# Patient Record
Sex: Male | Born: 1995 | Race: White | Hispanic: No | Marital: Single | Smoking: Current some day smoker
Health system: Southern US, Community
[De-identification: ages and names within clinical notes are randomized; demographics above are authoritative.]

## PROBLEM LIST (undated history)

## (undated) DIAGNOSIS — F32A Depression, unspecified: Secondary | ICD-10-CM

## (undated) DIAGNOSIS — F329 Major depressive disorder, single episode, unspecified: Secondary | ICD-10-CM

## (undated) DIAGNOSIS — F419 Anxiety disorder, unspecified: Secondary | ICD-10-CM

## (undated) DIAGNOSIS — F909 Attention-deficit hyperactivity disorder, unspecified type: Secondary | ICD-10-CM

## (undated) HISTORY — DX: Attention-deficit hyperactivity disorder, unspecified type: F90.9

## (undated) HISTORY — PX: TONSILLECTOMY: SUR1361

---
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
## 2017-02-28 ENCOUNTER — Telehealth: Payer: Self-pay | Admitting: Internal Medicine

## 2017-02-28 DIAGNOSIS — F902 Attention-deficit hyperactivity disorder, combined type: Secondary | ICD-10-CM

## 2017-02-28 NOTE — Telephone Encounter (Signed)
Mother came back and said that pt needs a refill on his Adderall .  Pt is completely out   Best number [phone] mother

## 2017-02-28 NOTE — Telephone Encounter (Signed)
Patient needs an appointment, please call and schedule appt. Thank you!

## 2017-03-01 MED ORDER — AMPHETAMINE-DEXTROAMPHETAMINE 20 MG PO TABS: 20.0000 mg | ORAL_TABLET | Freq: Every day | ORAL | 0 refills | Status: DC

## 2017-03-01 NOTE — Telephone Encounter (Signed)
appt made for pt, 30 day supply given

## 2017-03-12 ENCOUNTER — Ambulatory Visit (INDEPENDENT_AMBULATORY_CARE_PROVIDER_SITE_OTHER): Payer: BLUE CROSS/BLUE SHIELD | Admitting: Internal Medicine

## 2017-03-12 ENCOUNTER — Encounter: Payer: Self-pay | Admitting: Internal Medicine

## 2017-03-12 VITALS — BP 116/70 | HR 60 | Temp 98.2°F | Resp 16 | Ht 70.28 in | Wt 152.0 lb

## 2017-03-12 DIAGNOSIS — F902 Attention-deficit hyperactivity disorder, combined type: Secondary | ICD-10-CM | POA: Diagnosis not present

## 2017-03-12 MED ORDER — AMPHETAMINE-DEXTROAMPHETAMINE 20 MG PO TABS: 20.0000 mg | ORAL_TABLET | Freq: Every day | ORAL | 0 refills | Status: DC

## 2017-03-12 NOTE — Patient Instructions (Signed)

## 2017-03-12 NOTE — Progress Notes (Signed)
Subjective:  Patient ID: Edwin Ortiz, male    DOB: Oct 18, 1996  Age: 21 y.o. MRN: [id number]  CC: ADHD   HPI Edwin Ortiz presents for f/up -  Pt states ADD status overall stable on current meds with overall good compliance and tolerability, and good effectiveness with respect to ability for concentration and task completion.  Outpatient Medications Prior to Visit  Medication Sig Dispense Refill  . amphetamine-dextroamphetamine (ADDERALL) 20 MG tablet Take 1 tablet (20 mg total) by mouth daily. 30 tablet 0   No facility-administered medications prior to visit.     ROS Review of Systems  Constitutional: Negative.  Negative for appetite change, diaphoresis, fatigue and unexpected weight change.  HENT: Negative.   Eyes: Negative for visual disturbance.  Respiratory: Negative for chest tightness, shortness of breath and wheezing.   Cardiovascular: Negative for chest pain, palpitations and leg swelling.  Gastrointestinal: Negative for abdominal pain and diarrhea.  Endocrine: Negative.   Genitourinary: Negative.  Negative for difficulty urinating.  Musculoskeletal: Negative.   Skin: Negative.   Allergic/Immunologic: Negative.   Neurological: Negative.  Negative for dizziness and headaches.  Hematological: Negative for adenopathy. Does not bruise/bleed easily.  Psychiatric/Behavioral: Negative for agitation, behavioral problems and decreased concentration. The patient is not nervous/anxious and is not hyperactive.     Objective:  BP 116/70 (BP Location: Left Arm, Patient Position: Sitting, Cuff Size: Normal)   Pulse 60   Temp 98.2 F (36.8 C) (Oral)   Resp 16   Ht 5' 10.28" (1.785 m)   Wt 152 lb (68.9 kg)   SpO2 97%   BMI 21.64 kg/m   BP Readings from Last 3 Encounters:  03/12/17 116/70  09/07/16 122/72  08/13/16 122/72    Wt Readings from Last 3 Encounters:  03/12/17 152 lb (68.9 kg) (50 %, Z= 0.01)*  09/07/16 152 lb (68.9 kg) (54 %, Z= 0.09)*  08/13/16  148 lb (67.1 kg) (48 %, Z= -0.06)*   * Growth percentiles are based on CDC 2-20 Years data.    Physical Exam  Constitutional: He is oriented to person, place, and time. No distress.  HENT:  Mouth/Throat: Oropharynx is clear and moist. No oropharyngeal exudate.  Eyes: Conjunctivae are normal. Right eye exhibits no discharge. Left eye exhibits no discharge. No scleral icterus.  Neck: Normal range of motion. Neck supple. No JVD present. No tracheal deviation present. No thyromegaly present.  Cardiovascular: Normal rate, regular rhythm, normal heart sounds and intact distal pulses.  Exam reveals no gallop and no friction rub.   No murmur heard. Pulmonary/Chest: Effort normal and breath sounds normal. No stridor. No respiratory distress. He has no wheezes. He has no rales. He exhibits no tenderness.  Abdominal: Soft. Bowel sounds are normal. He exhibits no distension and no mass. There is no tenderness. There is no rebound and no guarding.  Musculoskeletal: Normal range of motion. He exhibits no edema, tenderness or deformity.  Lymphadenopathy:    He has no cervical adenopathy.  Neurological: He is oriented to person, place, and time.  Skin: Skin is warm and dry. No rash noted. He is not diaphoretic. No erythema. No pallor.  Psychiatric: He has a normal mood and affect. His behavior is normal. Judgment and thought content normal.  Vitals reviewed.   Lab Results  Component Value Date   WBC 10.0 04/25/2016   HGB 15.1 04/25/2016   HCT 45.4 04/25/2016   PLT 352.0 04/25/2016   GLUCOSE 97 04/25/2016   CHOL 127 04/25/2016  TRIG 127.0 04/25/2016   HDL 36.70 (L) 04/25/2016   LDLCALC 64 04/25/2016   ALT 29 04/25/2016   AST 44 (H) 04/25/2016   NA 137 04/25/2016   K 4.6 04/25/2016   CL 102 04/25/2016   CREATININE 1.00 04/25/2016   BUN 10 04/25/2016   CO2 32 04/25/2016   TSH 1.02 04/25/2016    Dg Hand Complete Left  Result Date: 05/07/2016 CLINICAL DATA:  The patient punched a bookcase  today with onset of left hand pain. EXAM: LEFT HAND - COMPLETE 3+ VIEW COMPARISON:  None. FINDINGS: The patient has a fracture of the distal diaphysis of the fifth metacarpal with slight dorsal displacement of the distal fragment and volar angulation of the metacarpal head. No other acute bony or joint abnormality is identified. IMPRESSION: Acute fracture distal diaphysis left fifth metacarpal as described. Electronically Signed   By: Drusilla Kannerhomas  Dalessio M.D.   On: 05/07/2016 20:56    Assessment & Plan:   Edwin Ortiz was seen today for adhd.  Diagnoses and all orders for this visit:  Attention deficit hyperactivity disorder (ADHD), combined type- He is doing well on the current dose of Adderall, he is finishing high school and will attend [employer] next year. -     Discontinue: amphetamine-dextroamphetamine (ADDERALL) 20 MG tablet; Take 1 tablet (20 mg total) by mouth daily. -     Discontinue: amphetamine-dextroamphetamine (ADDERALL) 20 MG tablet; Take 1 tablet (20 mg total) by mouth daily. -     Discontinue: amphetamine-dextroamphetamine (ADDERALL) 20 MG tablet; Take 1 tablet (20 mg total) by mouth daily. -     amphetamine-dextroamphetamine (ADDERALL) 20 MG tablet; Take 1 tablet (20 mg total) by mouth daily.   I am having Edwin Ortiz maintain his amphetamine-dextroamphetamine.  Meds ordered this encounter  Medications  . DISCONTD: amphetamine-dextroamphetamine (ADDERALL) 20 MG tablet    Sig: Take 1 tablet (20 mg total) by mouth daily.    Dispense:  30 tablet    Refill:  0  . DISCONTD: amphetamine-dextroamphetamine (ADDERALL) 20 MG tablet    Sig: Take 1 tablet (20 mg total) by mouth daily.    Dispense:  30 tablet    Refill:  0  . DISCONTD: amphetamine-dextroamphetamine (ADDERALL) 20 MG tablet    Sig: Take 1 tablet (20 mg total) by mouth daily.    Dispense:  30 tablet    Refill:  0  . amphetamine-dextroamphetamine (ADDERALL) 20 MG tablet    Sig: Take 1 tablet (20 mg total) by mouth daily.     Dispense:  30 tablet    Refill:  0     Follow-up: Return if symptoms worsen or fail to improve.  Sanda Lingerhomas Deiondre Harrower, MD

## 2017-04-25 ENCOUNTER — Ambulatory Visit (INDEPENDENT_AMBULATORY_CARE_PROVIDER_SITE_OTHER): Payer: BLUE CROSS/BLUE SHIELD | Admitting: Internal Medicine

## 2017-04-25 ENCOUNTER — Other Ambulatory Visit: Payer: Self-pay | Admitting: Orthopedic Surgery

## 2017-04-25 ENCOUNTER — Ambulatory Visit (HOSPITAL_COMMUNITY): Payer: BLUE CROSS/BLUE SHIELD | Admitting: Anesthesiology

## 2017-04-25 ENCOUNTER — Ambulatory Visit (HOSPITAL_COMMUNITY)
Admission: AD | Admit: 2017-04-25 | Discharge: 2017-04-25 | Disposition: A | Discharge status: Home / Self Care | Payer: BLUE CROSS/BLUE SHIELD | Source: Ambulatory Visit | Attending: Orthopedic Surgery | Admitting: Orthopedic Surgery

## 2017-04-25 ENCOUNTER — Encounter (HOSPITAL_COMMUNITY): Payer: Self-pay | Admitting: *Deleted

## 2017-04-25 ENCOUNTER — Encounter: Payer: Self-pay | Admitting: Internal Medicine

## 2017-04-25 ENCOUNTER — Encounter (HOSPITAL_COMMUNITY)
Admission: AD | Disposition: A | Discharge status: Home / Self Care | Payer: Self-pay | Source: Ambulatory Visit | Attending: Orthopedic Surgery

## 2017-04-25 VITALS — BP 120/80 | HR 67 | Ht 70.0 in | Wt 148.0 lb

## 2017-04-25 DIAGNOSIS — B9562 Methicillin resistant Staphylococcus aureus infection as the cause of diseases classified elsewhere: Secondary | ICD-10-CM | POA: Diagnosis not present

## 2017-04-25 DIAGNOSIS — M65041 Abscess of tendon sheath, right hand: Secondary | ICD-10-CM | POA: Diagnosis not present

## 2017-04-25 DIAGNOSIS — Z1611 Resistance to penicillins: Secondary | ICD-10-CM | POA: Diagnosis not present

## 2017-04-25 DIAGNOSIS — F17200 Nicotine dependence, unspecified, uncomplicated: Secondary | ICD-10-CM | POA: Insufficient documentation

## 2017-04-25 DIAGNOSIS — F909 Attention-deficit hyperactivity disorder, unspecified type: Secondary | ICD-10-CM | POA: Insufficient documentation

## 2017-04-25 DIAGNOSIS — L02511 Cutaneous abscess of right hand: Secondary | ICD-10-CM | POA: Insufficient documentation

## 2017-04-25 DIAGNOSIS — Z8249 Family history of ischemic heart disease and other diseases of the circulatory system: Secondary | ICD-10-CM | POA: Diagnosis not present

## 2017-04-25 DIAGNOSIS — Z806 Family history of leukemia: Secondary | ICD-10-CM | POA: Diagnosis not present

## 2017-04-25 DIAGNOSIS — L02519 Cutaneous abscess of unspecified hand: Secondary | ICD-10-CM | POA: Diagnosis present

## 2017-04-25 HISTORY — DX: Anxiety disorder, unspecified: F41.9

## 2017-04-25 HISTORY — DX: Major depressive disorder, single episode, unspecified: F32.9

## 2017-04-25 HISTORY — DX: Depression, unspecified: F32.A

## 2017-04-25 HISTORY — PX: I&D EXTREMITY: SHX5045

## 2017-04-25 LAB — CBC
HCT: 43.9 % (ref 39.0–52.0)
Hemoglobin: 14 g/dL (ref 13.0–17.0)
MCH: 28.6 pg (ref 26.0–34.0)
MCHC: 31.9 g/dL (ref 30.0–36.0)
MCV: 89.8 fL (ref 78.0–100.0)
PLATELETS: 286 10*3/uL (ref 150–400)
RBC: 4.89 MIL/uL (ref 4.22–5.81)
RDW: 13.7 % (ref 11.5–15.5)
WBC: 12 10*3/uL — AB (ref 4.0–10.5)

## 2017-04-25 LAB — COMPREHENSIVE METABOLIC PANEL
ALT: 30 U/L (ref 17–63)
AST: 30 U/L (ref 15–41)
Albumin: 4.2 g/dL (ref 3.5–5.0)
Alkaline Phosphatase: 90 U/L (ref 38–126)
Anion gap: 8 (ref 5–15)
BUN: 13 mg/dL (ref 6–20)
CHLORIDE: 103 mmol/L (ref 101–111)
CO2: 26 mmol/L (ref 22–32)
Calcium: 9.1 mg/dL (ref 8.9–10.3)
Creatinine, Ser: 0.89 mg/dL (ref 0.61–1.24)
GFR calc Af Amer: >60 mL/min (ref >60)
Glucose, Bld: 90 mg/dL (ref 65–99)
POTASSIUM: 3.7 mmol/L (ref 3.5–5.1)
Sodium: 137 mmol/L (ref 135–145)
Total Bilirubin: 0.5 mg/dL (ref 0.3–1.2)
Total Protein: 6.8 g/dL (ref 6.5–8.1)

## 2017-04-25 SURGERY — IRRIGATION AND DEBRIDEMENT EXTREMITY
Anesthesia: General | Site: Finger | Laterality: Right

## 2017-04-25 MED ORDER — LACTATED RINGERS IV SOLN
INTRAVENOUS | Status: DC
  Administered 2017-04-25: 19:00:00 via INTRAVENOUS

## 2017-04-25 MED ORDER — EPHEDRINE SULFATE 50 MG/ML IJ SOLN
INTRAMUSCULAR | Status: DC | PRN
  Administered 2017-04-25: 10 mg via INTRAVENOUS

## 2017-04-25 MED ORDER — MIDAZOLAM HCL 2 MG/2ML IJ SOLN
INTRAMUSCULAR | Status: AC
  Filled 2017-04-25: qty 2

## 2017-04-25 MED ORDER — CEFAZOLIN SODIUM-DEXTROSE 2-3 GM-% IV SOLR
INTRAVENOUS | Status: DC | PRN
  Administered 2017-04-25: 2 g via INTRAVENOUS

## 2017-04-25 MED ORDER — OXYCODONE HCL 5 MG PO TABS: 5.0000 mg | ORAL_TABLET | Freq: Once | ORAL | Status: DC | PRN

## 2017-04-25 MED ORDER — FENTANYL CITRATE (PF) 100 MCG/2ML IJ SOLN
50.0000 ug | Freq: Once | INTRAMUSCULAR | Status: AC
  Administered 2017-04-25: 50 ug via INTRAVENOUS
  Filled 2017-04-25: qty 1

## 2017-04-25 MED ORDER — LIDOCAINE HCL (CARDIAC) 20 MG/ML IV SOLN
INTRAVENOUS | Status: AC
  Filled 2017-04-25: qty 5

## 2017-04-25 MED ORDER — FENTANYL CITRATE (PF) 250 MCG/5ML IJ SOLN
INTRAMUSCULAR | Status: AC
  Filled 2017-04-25: qty 5

## 2017-04-25 MED ORDER — SULFAMETHOXAZOLE-TRIMETHOPRIM 800-160 MG PO TABS: 1.0000 | ORAL_TABLET | Freq: Two times a day (BID) | ORAL | 0 refills | Status: DC

## 2017-04-25 MED ORDER — SODIUM CHLORIDE 0.9 % IR SOLN
Status: DC | PRN
  Administered 2017-04-25: 3000 mL

## 2017-04-25 MED ORDER — LIDOCAINE HCL (CARDIAC) 20 MG/ML IV SOLN
INTRAVENOUS | Status: DC | PRN
  Administered 2017-04-25: 60 mg via INTRATRACHEAL

## 2017-04-25 MED ORDER — ONDANSETRON HCL 4 MG/2ML IJ SOLN: 4.0000 mg | Freq: Once | INTRAMUSCULAR | Status: DC | PRN

## 2017-04-25 MED ORDER — TRAMADOL HCL 50 MG PO TABS: 50.0000 mg | ORAL_TABLET | Freq: Three times a day (TID) | ORAL | 0 refills | Status: DC | PRN

## 2017-04-25 MED ORDER — FENTANYL CITRATE (PF) 250 MCG/5ML IJ SOLN
INTRAMUSCULAR | Status: DC | PRN
  Administered 2017-04-25: 125 ug via INTRAVENOUS
  Administered 2017-04-25: 50 ug via INTRAVENOUS

## 2017-04-25 MED ORDER — MIDAZOLAM HCL 2 MG/2ML IJ SOLN
1.0000 mg | Freq: Once | INTRAMUSCULAR | Status: AC
  Administered 2017-04-25: 1 mg via INTRAVENOUS
  Filled 2017-04-25: qty 1

## 2017-04-25 MED ORDER — OXYCODONE HCL 5 MG/5ML PO SOLN: 5.0000 mg | Freq: Once | ORAL | Status: DC | PRN

## 2017-04-25 MED ORDER — ONDANSETRON HCL 4 MG/2ML IJ SOLN
INTRAMUSCULAR | Status: DC | PRN
  Administered 2017-04-25: 4 mg via INTRAVENOUS

## 2017-04-25 MED ORDER — FENTANYL CITRATE (PF) 100 MCG/2ML IJ SOLN: 25.0000 ug | INTRAMUSCULAR | Status: DC | PRN

## 2017-04-25 MED ORDER — PROPOFOL 10 MG/ML IV BOLUS
INTRAVENOUS | Status: DC | PRN
  Administered 2017-04-25: 200 mg via INTRAVENOUS

## 2017-04-25 MED ORDER — BUPIVACAINE HCL (PF) 0.25 % IJ SOLN
INTRAMUSCULAR | Status: DC | PRN
  Administered 2017-04-25: 9 mL

## 2017-04-25 MED ORDER — MIDAZOLAM HCL 2 MG/2ML IJ SOLN
INTRAMUSCULAR | Status: AC
  Administered 2017-04-25: 1 mg via INTRAVENOUS
  Filled 2017-04-25: qty 2

## 2017-04-25 MED ORDER — HYDROCODONE-ACETAMINOPHEN 5-325 MG PO TABS: ORAL_TABLET | ORAL | 0 refills | Status: DC

## 2017-04-25 MED ORDER — BUPIVACAINE HCL (PF) 0.25 % IJ SOLN
INTRAMUSCULAR | Status: AC
  Filled 2017-04-25: qty 30

## 2017-04-25 MED ORDER — DEXAMETHASONE SODIUM PHOSPHATE 10 MG/ML IJ SOLN
INTRAMUSCULAR | Status: DC | PRN
  Administered 2017-04-25: 10 mg via INTRAVENOUS

## 2017-04-25 MED ORDER — PROPOFOL 10 MG/ML IV BOLUS
INTRAVENOUS | Status: AC
  Filled 2017-04-25: qty 20

## 2017-04-25 MED ORDER — CHLORHEXIDINE GLUCONATE 4 % EX LIQD: 60.0000 mL | Freq: Once | CUTANEOUS | Status: DC

## 2017-04-25 MED ORDER — AMOXICILLIN-POT CLAVULANATE 875-125 MG PO TABS: 1.0000 | ORAL_TABLET | Freq: Two times a day (BID) | ORAL | 0 refills | Status: DC

## 2017-04-25 MED ORDER — FENTANYL CITRATE (PF) 100 MCG/2ML IJ SOLN
INTRAMUSCULAR | Status: AC
  Administered 2017-04-25: 50 ug via INTRAVENOUS
  Filled 2017-04-25: qty 2

## 2017-04-25 MED ORDER — 0.9 % SODIUM CHLORIDE (POUR BTL) OPTIME
TOPICAL | Status: DC | PRN
  Administered 2017-04-25: 1000 mL

## 2017-04-25 SURGICAL SUPPLY — 55 items
BANDAGE ACE 3X5.8 VEL STRL LF (GAUZE/BANDAGES/DRESSINGS) ×3 IMPLANT
BANDAGE ACE 4X5 VEL STRL LF (GAUZE/BANDAGES/DRESSINGS) ×3 IMPLANT
BANDAGE COBAN STERILE 2 (GAUZE/BANDAGES/DRESSINGS) IMPLANT
BNDG COHESIVE 1X5 TAN STRL LF (GAUZE/BANDAGES/DRESSINGS) IMPLANT
BNDG CONFORM 2 STRL LF (GAUZE/BANDAGES/DRESSINGS) IMPLANT
BNDG ESMARK 4X9 LF (GAUZE/BANDAGES/DRESSINGS) ×3 IMPLANT
BNDG GAUZE ELAST 4 BULKY (GAUZE/BANDAGES/DRESSINGS) ×3 IMPLANT
BNDG PLASTER X FAST 3X3 WHT LF (CAST SUPPLIES) ×3 IMPLANT
CORDS BIPOLAR (ELECTRODE) ×3 IMPLANT
COVER SURGICAL LIGHT HANDLE (MISCELLANEOUS) ×3 IMPLANT
CUFF TOURNIQUET SINGLE 18IN (TOURNIQUET CUFF) ×3 IMPLANT
DECANTER SPIKE VIAL GLASS SM (MISCELLANEOUS) IMPLANT
DRAIN PENROSE 1/4X12 LTX STRL (WOUND CARE) IMPLANT
DRSG ADAPTIC 3X8 NADH LF (GAUZE/BANDAGES/DRESSINGS) IMPLANT
DRSG EMULSION OIL 3X3 NADH (GAUZE/BANDAGES/DRESSINGS) IMPLANT
DRSG PAD ABDOMINAL 8X10 ST (GAUZE/BANDAGES/DRESSINGS) IMPLANT
FACESHIELD WRAPAROUND (MASK) ×3 IMPLANT
GAUZE PACKING IODOFORM 1/4X15 (GAUZE/BANDAGES/DRESSINGS) ×3 IMPLANT
GAUZE SPONGE 4X4 12PLY STRL (GAUZE/BANDAGES/DRESSINGS) ×3 IMPLANT
GAUZE SPONGE 4X4 12PLY STRL LF (GAUZE/BANDAGES/DRESSINGS) ×3 IMPLANT
GAUZE XEROFORM 1X8 LF (GAUZE/BANDAGES/DRESSINGS) IMPLANT
GLOVE BIO SURGEON STRL SZ7.5 (GLOVE) ×3 IMPLANT
GLOVE BIOGEL PI IND STRL 8 (GLOVE) ×1 IMPLANT
GLOVE BIOGEL PI INDICATOR 8 (GLOVE) ×2
GOWN STRL REUS W/ TWL LRG LVL3 (GOWN DISPOSABLE) ×1 IMPLANT
GOWN STRL REUS W/TWL LRG LVL3 (GOWN DISPOSABLE) ×2
KIT BASIN OR (CUSTOM PROCEDURE TRAY) ×3 IMPLANT
KIT ROOM TURNOVER OR (KITS) ×3 IMPLANT
LOOP VESSEL MAXI BLUE (MISCELLANEOUS) IMPLANT
MANIFOLD NEPTUNE II (INSTRUMENTS) ×3 IMPLANT
NEEDLE HYPO 25X1 1.5 SAFETY (NEEDLE) ×3 IMPLANT
NS IRRIG 1000ML POUR BTL (IV SOLUTION) IMPLANT
PACK ORTHO EXTREMITY (CUSTOM PROCEDURE TRAY) ×3 IMPLANT
PAD ARMBOARD 7.5X6 YLW CONV (MISCELLANEOUS) ×6 IMPLANT
PAD CAST 4YDX4 CTTN HI CHSV (CAST SUPPLIES) ×1 IMPLANT
PADDING CAST COTTON 4X4 STRL (CAST SUPPLIES) ×2
SCRUB BETADINE 4OZ XXX (MISCELLANEOUS) ×3 IMPLANT
SET CYSTO W/LG BORE CLAMP LF (SET/KITS/TRAYS/PACK) ×3 IMPLANT
SOL PREP POV-IOD 4OZ 10% (MISCELLANEOUS) ×3 IMPLANT
SPONGE LAP 4X18 X RAY DECT (DISPOSABLE) ×3 IMPLANT
SUT ETHILON 4 0 P 3 18 (SUTURE) ×3 IMPLANT
SUT ETHILON 4 0 PS 2 18 (SUTURE) ×3 IMPLANT
SUT MON AB 5-0 P3 18 (SUTURE) IMPLANT
SWAB CULTURE ESWAB REG 1ML (MISCELLANEOUS) IMPLANT
SYR CONTROL 10ML LL (SYRINGE) ×3 IMPLANT
TOWEL OR 17X24 6PK STRL BLUE (TOWEL DISPOSABLE) ×3 IMPLANT
TOWEL OR 17X26 10 PK STRL BLUE (TOWEL DISPOSABLE) ×3 IMPLANT
TUBE ANAEROBIC SPECIMEN COL (MISCELLANEOUS) ×3 IMPLANT
TUBE CONNECTING 12'X1/4 (SUCTIONS) ×1
TUBE CONNECTING 12X1/4 (SUCTIONS) ×2 IMPLANT
TUBE FEEDING ENTERAL 5FR 16IN (TUBING) ×3 IMPLANT
TUBING CYSTO DISP (UROLOGICAL SUPPLIES) ×3 IMPLANT
UNDERPAD 30X30 (UNDERPADS AND DIAPERS) ×3 IMPLANT
WATER STERILE IRR 1000ML POUR (IV SOLUTION) ×3 IMPLANT
YANKAUER SUCT BULB TIP NO VENT (SUCTIONS) ×3 IMPLANT

## 2017-04-25 NOTE — Anesthesia Procedure Notes (Signed)
Procedure Name: LMA Insertion Date/Time: 04/25/2017 8:27 PM Performed by: Molli HazardGORDON, Cherish Runde M Pre-anesthesia Checklist: Patient identified, Emergency Drugs available, Suction available and Patient being monitored Patient Re-evaluated:Patient Re-evaluated prior to inductionOxygen Delivery Method: Circle system utilized Preoxygenation: Pre-oxygenation with 100% oxygen Intubation Type: IV induction Ventilation: Mask ventilation without difficulty LMA: LMA inserted LMA Size: 4.0 Number of attempts: 1 Placement Confirmation: positive ETCO2 Tube secured with: Tape Dental Injury: Teeth and Oropharynx as per pre-operative assessment

## 2017-04-25 NOTE — Anesthesia Postprocedure Evaluation (Signed)
Anesthesia Post Note  Patient: Roseanne RenoJohn Parker Bohac  Procedure(s) Performed: Procedure(s) (LRB): IRRIGATION AND DEBRIDEMENT SMALL FINGER (Right)     Patient location during evaluation: PACU Anesthesia Type: General Level of consciousness: awake, awake and alert and oriented Pain management: pain level controlled Vital Signs Assessment: post-procedure vital signs reviewed and stable Respiratory status: spontaneous breathing, nonlabored ventilation and respiratory function stable Cardiovascular status: blood pressure returned to baseline Anesthetic complications: no    Last Vitals:  Vitals:   04/25/17 2130 04/25/17 2138  BP:  112/65  Pulse: (!) 57 64  Resp: 17 15  Temp:      Last Pain:  Vitals:   04/25/17 2138  PainSc: 0-No pain                 Faust Thorington COKER

## 2017-04-25 NOTE — Transfer of Care (Signed)
Immediate Anesthesia Transfer of Care Note  Patient: Edwin Ortiz  Procedure(s) Performed: Procedure(s): IRRIGATION AND DEBRIDEMENT SMALL FINGER (Right)  Patient Location: PACU  Anesthesia Type:General  Level of Consciousness: awake, alert  and oriented  Airway & Oxygen Therapy: Patient connected to nasal cannula oxygen  Post-op Assessment: Report given to RN and Post -op Vital signs reviewed and stable  Post vital signs: Reviewed and stable  Last Vitals:  Vitals:   04/25/17 2123  Temp: (P) 36.5 C    Last Pain:  Vitals:   04/25/17 1834  PainSc: 10-Worst pain ever      Patients Stated Pain Goal: 8 (04/25/17 1834)  Complications: No apparent anesthesia complications

## 2017-04-25 NOTE — Patient Instructions (Signed)
Please take all new medication as prescribed - the antibiotic, and pain medication if needed  Please continue all other medications as before, and refills have been done if requested.  Please have the pharmacy call with any other refills you may need.  Please keep your appointments with your specialists as you may have planned  You will be contacted regarding the referral for: Hand Surg[facility] asap - to see [facility] now

## 2017-04-25 NOTE — Op Note (Signed)
NAME:  Edwin Ortiz, Edwin Ortiz                 ACCOUNT NO.:  [id number]  MEDICAL RECORD NO.:  [id number]  LOCATION:                                 FACILITY:  PHYSICIAN:  Betha LoaKevin Konica Stankowski, MD             DATE OF BIRTH:  DATE OF PROCEDURE:  04/25/2017 DATE OF DISCHARGE:                              OPERATIVE REPORT   PREOPERATIVE DIAGNOSIS:  Right small finger abscess.  POSTOPERATIVE DIAGNOSIS:  Right small finger abscess with flexor sheath infection.  PROCEDURE:  Incision and drainage of right small finger subcutaneous abscess and flexor tendon sheath.  SURGEON:  Betha LoaKevin Alvia Tory, MD  ASSISTANT:  None.  ANESTHESIA:  General.  IV FLUIDS:  Per anesthesia flow sheet.  ESTIMATED BLOOD LOSS:  Minimal.  COMPLICATIONS:  None.  SPECIMENS:  Cultures to Micro.  TOURNIQUET TIME:  25 minutes.  DISPOSITION:  Stable to PACU.  INDICATIONS:  Edwin Ortiz is an 21 year old left-hand dominant male, who over the past several days has had worsening swelling, erythema and pain of the right small finger.  He was seen by his primary care physician today and referred to me for further care.  I recommended incision and drainage in the operating room.  Risks, benefits and alternatives of the surgery were discussed including the risk of blood loss; infection; damage to nerves, vessels, tendons, ligaments, bone; failure of surgery; need for additional surgery; complications with wound healing; continued pain; continued infection; need for repeat irrigation and debridement. He voiced understanding of these risks and elected to proceed.  OPERATIVE COURSE:  After being identified preoperatively by myself, the patient and I agreed upon the procedure and site of procedure.  Surgical site was marked.  Risks, benefits and alternatives of the surgery were reviewed and he wished to proceed.  Surgical consent had been signed. He was transferred to the operating room and placed on the operating room table in supine position  with the right upper extremity on an armboard.  General anesthesia was induced by anesthesiologist.  Right upper extremity was prepped and draped in normal sterile orthopedic fashion.  A surgical pause was performed between surgeons, anesthesia and operating room staff; and all were in agreement as to the patient, procedure and site of procedure.  Tourniquet at the proximal aspect of the extremity was inflated to 250 mmHg after exsanguination of the limb with an Esmarch bandage.  Incision was made over the middle phalanx in the area of swelling and erythema.  This was carried into subcutaneous tissues by spreading technique.  Gross purulence was encountered. Cultures were taken for aerobes and anaerobes.  Incision was additionally made at the distal phalanx and gross purulence was encountered as well.  The two incisions were connected in a Brunner-type fashion.  The gross purulence was removed with the pickups.  The radial digital nerve and artery were identified and protected throughout the case.  There was a branch coursing across the surgical field, which was protected.  The sheath was visualized distally.  The finger was milked from proximally.  Some brownish watery fluid was produced from within the sheath.  It was felt that irrigation of the sheath  would be appropriate.  An incision was made at the volar aspect of the MP joint of the small finger.  This was again carried into subcutaneous tissues by spreading technique.  The sheath was opened at the proximal aspect of the A1 pulley.  The wrist and hand were milked from proximally.  No fluid came from proximally.  #5 pediatric feeding tube was then placed into the sheath from the proximal wound.  This was then used to irrigate with sterile saline.  Good effluent was obtained from both the proximal and distal wounds.  The sheath was copiously irrigated with sterile saline.  The wounds were then irrigated with sterile saline and  the feeding tube removed.  The wounds were all packed with 0.25-inch iodoform gauze.  A single 4-0 nylon suture was placed in the corner of the incision distally to keep the corner in appropriate position.  There was still plenty of wound open for drainage as necessary.  Digital block was performed with 9 mL of 0.25% plain Marcaine to aid in postoperative analgesia.  The wounds were dressed with sterile 4x4s and wrapped with a Kerlix bandage.  A dorsal splint was placed including the long, ring and small fingers.  This was wrapped with Kerlix and Ace bandage. Tourniquet was deflated at 25 minutes.  Fingertips were all pink with brisk capillary refill after deflation of the tourniquet.  The operative drapes were broken down, the patient was awoken from anesthesia safely. He was transferred back to the stretcher and taken to PACU in stable condition.  I will see him back in the office in 4 days for postoperative followup.  I will give him Norco 5/325 one to two p.o. q.6 hours p.r.n. pain, dispensed #30 and Bactrim DS 1 p.o. b.i.d. x7 days.     Betha Loa, MD     KK/MEDQ  D:  04/25/2017  T:  04/25/2017  Job:  [id number]

## 2017-04-25 NOTE — Assessment & Plan Note (Signed)
Mod to severe,  for augmentin course, pain control, and refer to hand surgury asap for possible I&D,  to f/u any worsening symptoms or concerns

## 2017-04-25 NOTE — H&P (Signed)
Edwin Ortiz is an 21 y.o. male.   Chief Complaint: right small finger infection HPI: Edwin Ortiz is an 21 year old left-hand dominant male who is noted issues with his right small finger over the past 4 days.  He is noted increasing swelling and erythema.  He has a throbbing pain that reaches 10 out of 10 in severity.  Is alleviated by nothing and aggravated with palpation.  He has had no fevers chills or night sweats.  He does not take any medications.  He saw his primary care physician today who referred him here for further care.  Xrays viewed and interpreted by me: ap and lateral views of right small finger no fractures, dislocations, radioopaque foreign bodies. Labs reviewed: none  Allergies: No Known Allergies  Past Medical History:  Diagnosis Date  . ADHD (attention deficit hyperactivity disorder) 2017    Past Surgical History:  Procedure Laterality Date  . ANTERIOR CRUCIATE LIGAMENT REPAIR Left 09/27/2015  . TONSILLECTOMY      Family History: Family History  Problem Relation Age of Onset  . Hypertension Paternal Grandmother   . Hypertension Mother   . High Cholesterol Mother   . High Cholesterol Father   . Leukemia Paternal Grandfather   . Cancer Neg Hx   . Diabetes Neg Hx   . Drug abuse Neg Hx   . Alcohol abuse Neg Hx   . Heart disease Neg Hx   . Hyperlipidemia Neg Hx   . Stroke Neg Hx     Social History:   reports that he has been smoking.  He has a 0.50 pack-year smoking history. He has never used smokeless tobacco. He reports that he drinks about 3.0 oz of alcohol per week . He reports that he uses drugs, including Marijuana.  Medications: No prescriptions prior to admission.    No results found for this or any previous visit (from the past 48 hour(s)).  No results found.   A comprehensive review of systems was negative. Review of Systems: No fevers, chills, night sweats, chest pain, shortness of breath, nausea, vomiting, diarrhea, constipation,  easy bleeding or bruising, headaches, dizziness, vision changes, fainting.   There were no vitals taken for this visit.  General appearance: alert, cooperative and appears stated age Head: Normocephalic, without obvious abnormality, atraumatic Neck: supple, symmetrical, trachea midline Resp: clear to auscultation bilaterally Cardio: regular rate and rhythm GI: non-tender Extremities: Intact sensation and capillary refill all digits.  +epl/fpl/io.  Right small finger swollen and erythematous at middle and distal phalanges.  Tender to palpation.  No proximal streaking. Pulses: 2+ and symmetric Skin: Skin color, texture, turgor normal. No rashes or lesions Neurologic: Grossly normal Incision/Wound: none  Assessment/Plan Right small finger abscess.  Recommend incision and drainage in OR.  Risks, benefits, and alternatives of surgery were discussed and the patient agrees with the plan of care.   [name] R 04/25/2017, 5:20 PM

## 2017-04-25 NOTE — Progress Notes (Signed)
   Subjective:    Patient ID: Edwin Ortiz, male    DOB: 1996-10-14, 21 y.o.   MRN: [id number]  HPI  Here with 2-3 days onset gradually worsening now severe red, tender, pain, swelling and focused area of white in the pad of the most distal right fifth finger.  No fever,  No drainage.  [place] Past Medical History:  Diagnosis Date  . ADHD (attention deficit hyperactivity disorder) 2017   Past Surgical History:  Procedure Laterality Date  . ANTERIOR CRUCIATE LIGAMENT REPAIR Left 09/27/2015  . TONSILLECTOMY      reports that he has been smoking.  He has a 0.50 pack-year smoking history. He has never used smokeless tobacco. He reports that he drinks about 3.0 oz of alcohol per week . He reports that he uses drugs, including Marijuana. family history includes High Cholesterol in his father and mother; Hypertension in his mother and paternal grandmother; Leukemia in his paternal grandfather. No Known Allergies Current Outpatient Prescriptions on File Prior to Visit  Medication Sig Dispense Refill  . amphetamine-dextroamphetamine (ADDERALL) 20 MG tablet Take 1 tablet (20 mg total) by mouth daily. 30 tablet 0   No current facility-administered medications on file prior to visit.     Review of Systems All otherwise neg per pt    Objective:   Physical Exam BP 120/80   Pulse 67   Ht 5\' 10"  (1.778 m)   Wt 148 lb (67.1 kg)   SpO2 99%   BMI 21.24 kg/m  VS noted,  Constitutional: Pt appears in NAD HENT: Head: NCAT.  Right Ear: External ear normal.  Left Ear: External ear normal.  Eyes: . Pupils are equal, round, and reactive to light. Conjunctivae and EOM are normal Nose: without d/c or deformity Neck: Neck supple. Gross normal ROM Cardiovascular: Normal rate and regular rhythm.   Pulmonary/Chest: Effort normal and breath sounds without rales or wheezing.  Right fifth finger with middle and distal finger 2+ red, tender, swelling with distal tip with  < 1/2 cm area severe tender white area, + fluctuance Neurological: Pt is alert. At baseline orientation, motor grossly intact Skin: Skin is warm. No rashes, other new lesions, no LE edema Psychiatric: Pt behavior is normal without agitation      Assessment & Plan:

## 2017-04-25 NOTE — Discharge Instructions (Signed)

## 2017-04-25 NOTE — Brief Op Note (Signed)
04/25/2017  9:08 PM  PATIENT:  Edwin RenoJohn Parker Twichell  21 y.o. male  PRE-OPERATIVE DIAGNOSIS:  Right small finger abscess  POST-OPERATIVE DIAGNOSIS:  Right small finger abscess with flexor sheath infection  PROCEDURE:  Procedure(s): IRRIGATION AND DEBRIDEMENT SMALL FINGER (Right)  SURGEON:  Surgeon(s) and Role:    Betha Loa* Sonia Bromell, MD - Primary  PHYSICIAN ASSISTANT:   ASSISTANTS: none   ANESTHESIA:   general  EBL:  Total I/O In: 800 [I.V.:800] Out: 20 [Blood:20]  BLOOD ADMINISTERED:none  DRAINS: iodoform packing  LOCAL MEDICATIONS USED:  MARCAINE     SPECIMEN:  Source of Specimen:  right small finger  DISPOSITION OF SPECIMEN:  micro  COUNTS:  YES  TOURNIQUET:   Total Tourniquet Time Documented: Upper Arm (Right) - 25 minutes Total: Upper Arm (Right) - 25 minutes   DICTATION: .Other Dictation: Dictation Number [id number]  PLAN OF CARE: Discharge to home after PACU  PATIENT DISPOSITION:  PACU - hemodynamically stable.

## 2017-04-25 NOTE — Anesthesia Preprocedure Evaluation (Signed)
Anesthesia Evaluation  Patient identified by MRN, date of birth, ID band Patient awake    Reviewed: Allergy & Precautions, NPO status , Patient's Chart, lab work & pertinent test results  Airway Mallampati: I  TM Distance: >3 FB Neck ROM: Full    Dental  (+) Teeth Intact, Dental Advisory Given   Pulmonary Current Smoker,    breath sounds clear to auscultation       Cardiovascular  Rhythm:Regular Rate:Normal     Neuro/Psych    GI/Hepatic   Endo/Other    Renal/GU      Musculoskeletal   Abdominal   Peds  Hematology   Anesthesia Other Findings   Reproductive/Obstetrics                             Anesthesia Physical Anesthesia Plan  ASA: I and emergent  Anesthesia Plan: General   Post-op Pain Management:    Induction: Intravenous  PONV Risk Score and Plan: Ondansetron and Dexamethasone  Airway Management Planned: LMA  Additional Equipment:   Intra-op Plan:   Post-operative Plan:   Informed Consent: I have reviewed the patients History and Physical, chart, labs and discussed the procedure including the risks, benefits and alternatives for the proposed anesthesia with the patient or authorized representative who has indicated his/her understanding and acceptance.   Dental advisory given  Plan Discussed with: CRNA and Anesthesiologist  Anesthesia Plan Comments:         Anesthesia Quick Evaluation

## 2017-04-25 NOTE — Op Note (Signed)
[id number] 

## 2017-04-26 ENCOUNTER — Encounter (HOSPITAL_COMMUNITY): Payer: Self-pay | Admitting: Orthopedic Surgery

## 2017-04-28 ENCOUNTER — Other Ambulatory Visit: Payer: Self-pay | Admitting: Internal Medicine

## 2017-04-30 ENCOUNTER — Encounter (HOSPITAL_COMMUNITY): Payer: Self-pay | Admitting: Orthopedic Surgery

## 2017-04-30 NOTE — OR Nursing (Signed)
Addendum created to reflect correct times in PACU event log 

## 2017-05-01 LAB — AEROBIC/ANAEROBIC CULTURE (SURGICAL/DEEP WOUND)

## 2017-05-01 LAB — AEROBIC/ANAEROBIC CULTURE W GRAM STAIN (SURGICAL/DEEP WOUND)

## 2017-07-04 ENCOUNTER — Encounter: Payer: Self-pay | Admitting: Internal Medicine

## 2017-07-04 ENCOUNTER — Ambulatory Visit (INDEPENDENT_AMBULATORY_CARE_PROVIDER_SITE_OTHER): Payer: BLUE CROSS/BLUE SHIELD | Admitting: Internal Medicine

## 2017-07-04 DIAGNOSIS — L02415 Cutaneous abscess of right lower limb: Secondary | ICD-10-CM

## 2017-07-04 DIAGNOSIS — L0291 Cutaneous abscess, unspecified: Secondary | ICD-10-CM | POA: Insufficient documentation

## 2017-07-04 MED ORDER — SULFAMETHOXAZOLE-TRIMETHOPRIM 800-160 MG PO TABS: 1.0000 | ORAL_TABLET | Freq: Two times a day (BID) | ORAL | 0 refills | Status: DC

## 2017-07-04 NOTE — Assessment & Plan Note (Signed)
Rx for bactrim 1 week and return precautions discussed. Advised not to scratch and keep clean and dry.

## 2017-07-04 NOTE — Patient Instructions (Signed)
We have sent in the bactrim. Take 1 pill twice a day for 1 week.    

## 2017-07-04 NOTE — Progress Notes (Signed)
   Subjective:    Patient ID: Edwin RenoJohn Parker Marchetti, male    DOB: 10-13-1996, 21 y.o.   MRN: [id number]  HPI The patient is a 21 YO man coming in for bug bite which he scratched and got infected. He was scratching and had some skin breakdown. Now there is some surrounding redness. No target lesion. He did not have a tick bite. Denies fevers or chills. Scant drainage. Overall worsening.   Review of Systems  Constitutional: Negative for activity change, appetite change, fatigue, fever and unexpected weight change.  Respiratory: Negative.   Cardiovascular: Negative.   Gastrointestinal: Negative.   Musculoskeletal: Positive for myalgias. Negative for arthralgias, back pain and neck pain.  Skin: Positive for rash and wound. Negative for color change and pallor.  Neurological: Negative.       Objective:   Physical Exam  Constitutional: He is oriented to person, place, and time. He appears well-developed and well-nourished.  HENT:  Head: Normocephalic and atraumatic.  Eyes: EOM are normal.  Cardiovascular: Normal rate and regular rhythm.   Pulmonary/Chest: Effort normal. No respiratory distress. He has no wheezes. He has no rales.  Abdominal: Soft.  Neurological: He is alert and oriented to person, place, and time.  Skin: Skin is warm and dry.  Right lateral thigh above the knee infected wound, central scabbing with hard knot underneath, 3 mm scab with surrounding 1 cm diameter redness.    Vitals:   07/04/17 1452  BP: 100/68  Pulse: 78  Temp: 98.3 F (36.8 C)  TempSrc: Oral  SpO2: 99%  Weight: 143 lb (64.9 kg)  Height: 5' 10.2" (1.783 m)      Assessment & Plan:

## 2018-01-26 IMAGING — CR DG HAND COMPLETE 3+V*L*
3 series · 3 of 3 positions shown · non-contrast
Comparison: None.

CLINICAL DATA: The patient punched a bookcase today with onset of
left hand pain.

EXAM:
LEFT HAND - COMPLETE 3+ VIEW

[x hand pa left]
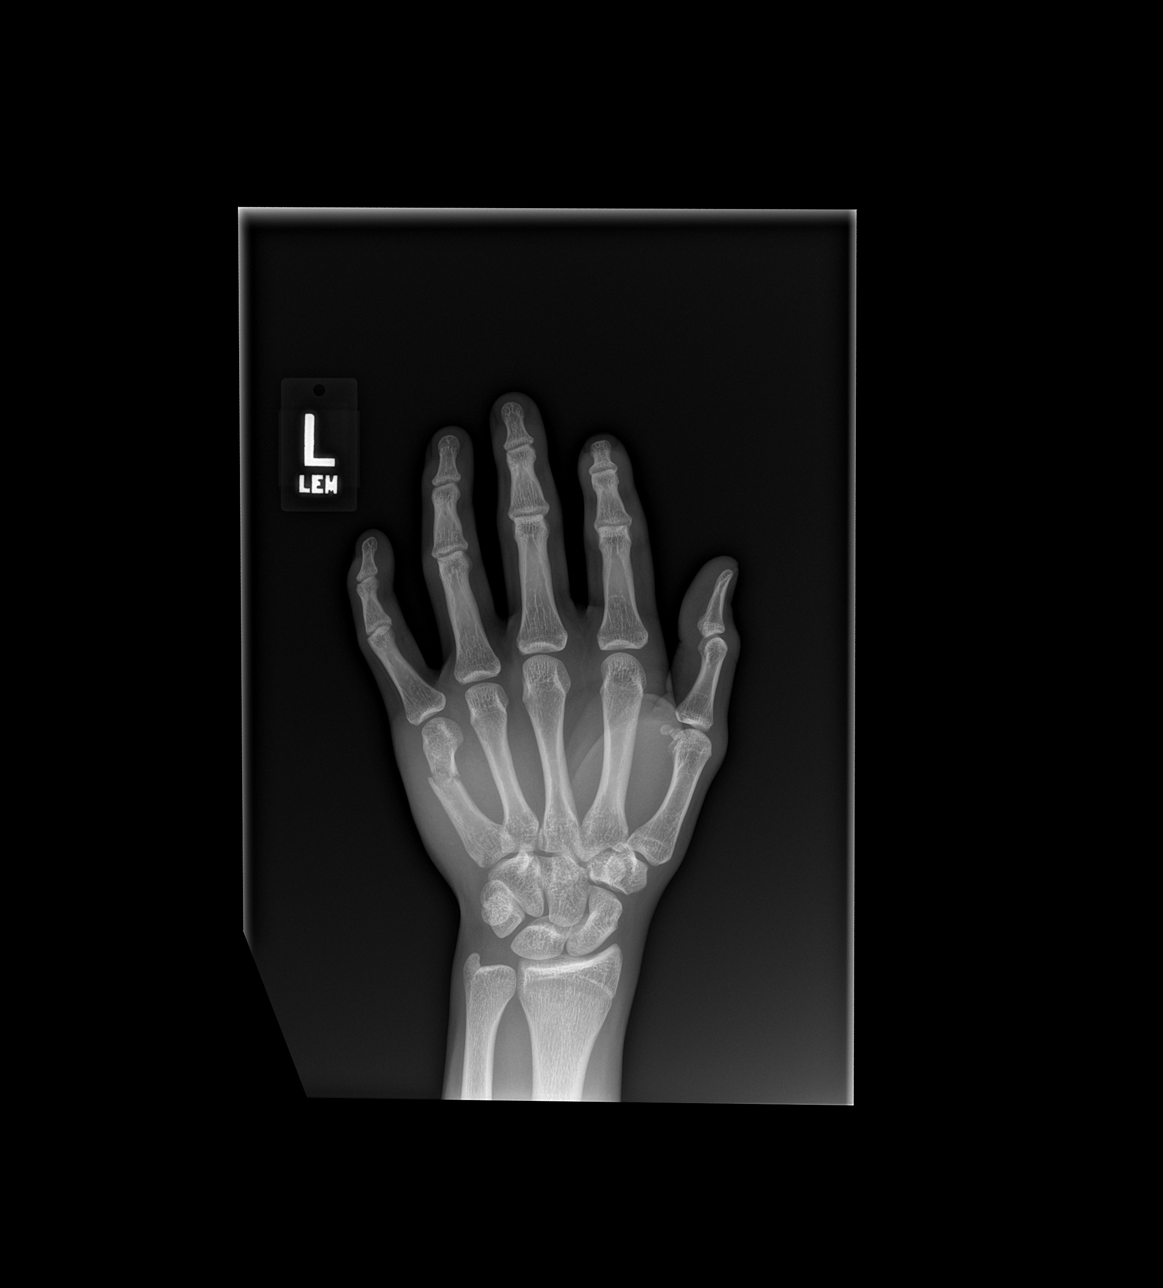

[x hand obl left]
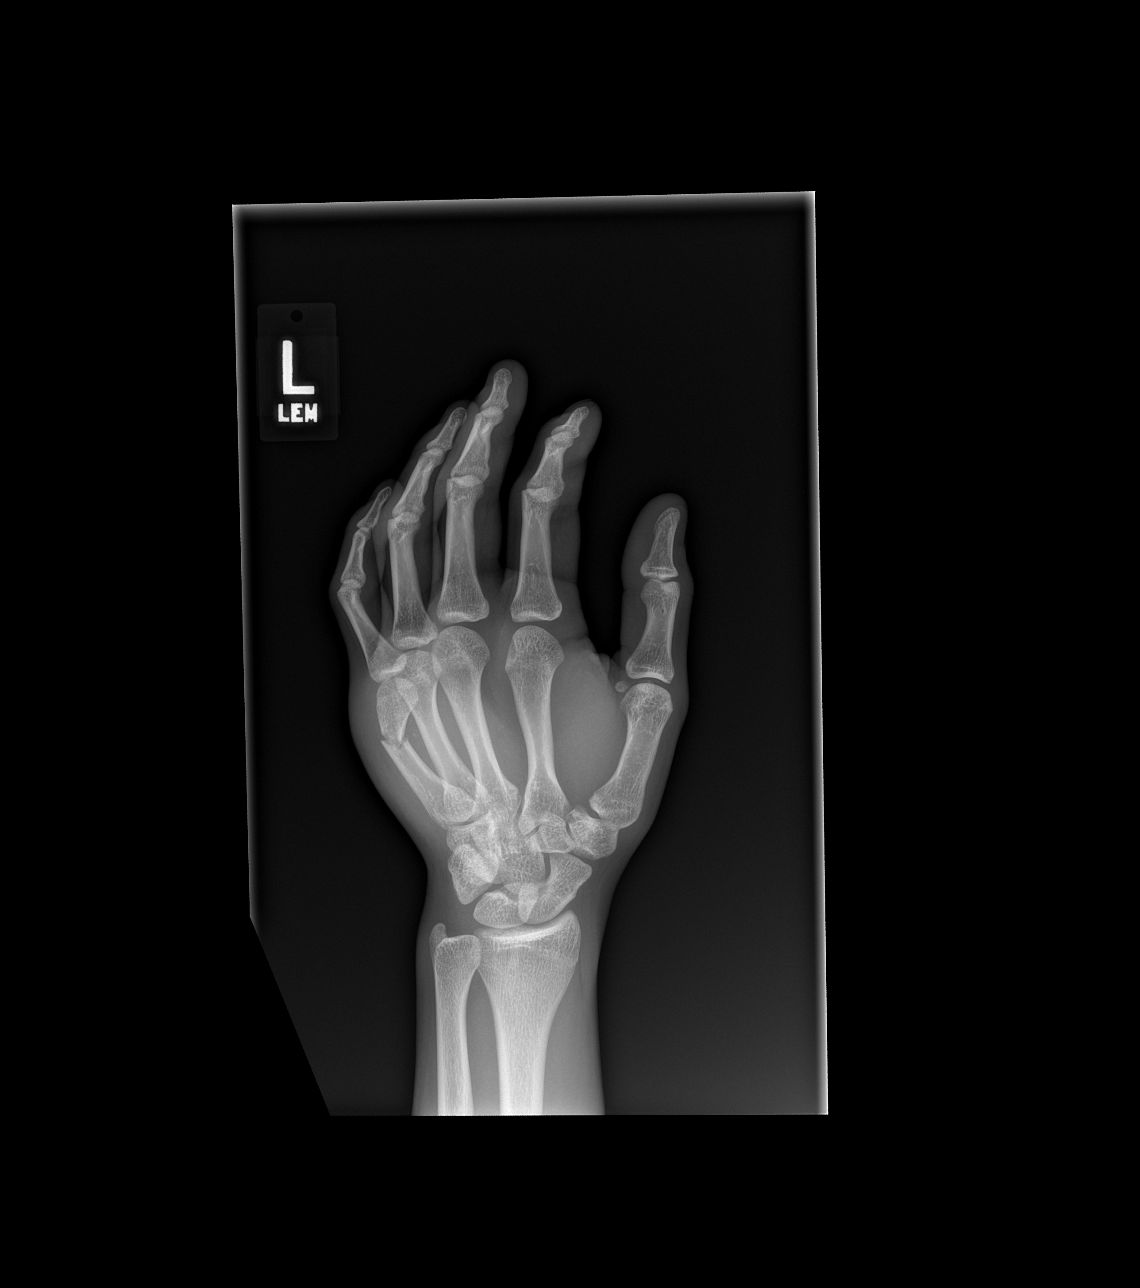

[x hand lat left]
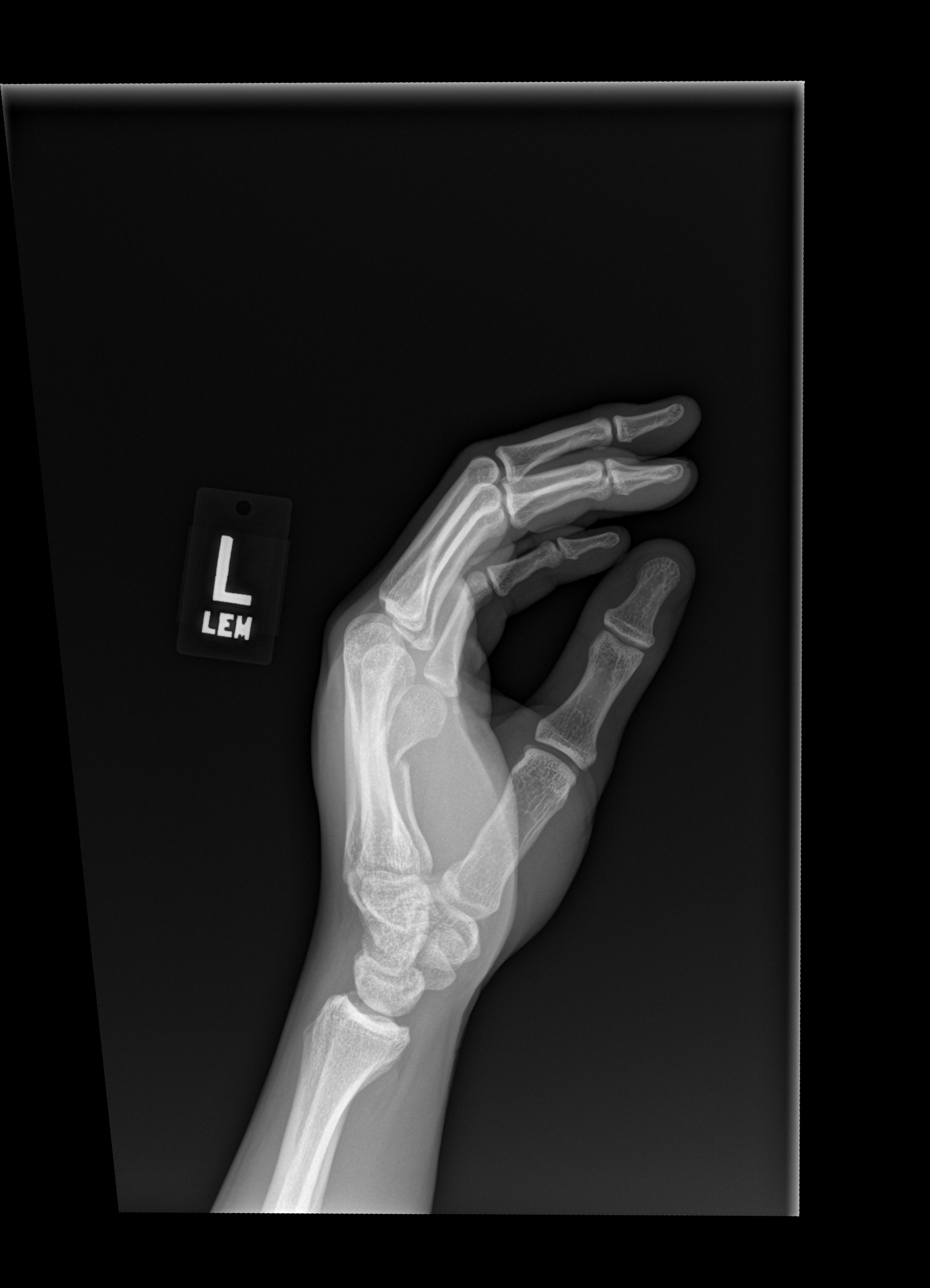

[3 of 3 positions shown; findings below may reference images not displayed]

FINDINGS: The patient has a fracture of the distal diaphysis of the fifth
metacarpal with slight dorsal displacement of the distal fragment
and volar angulation of the metacarpal head. No other acute bony or
joint abnormality is identified.
IMPRESSION: Acute fracture distal diaphysis left fifth metacarpal as described.

## 2018-08-25 ENCOUNTER — Other Ambulatory Visit: Payer: Self-pay | Admitting: Physician Assistant

## 2018-08-25 MED ORDER — LISDEXAMFETAMINE DIMESYLATE 70 MG PO CAPS: 70.0000 mg | ORAL_CAPSULE | Freq: Every day | ORAL | 0 refills | Status: DC

## 2018-10-06 ENCOUNTER — Encounter: Payer: Self-pay | Admitting: Emergency Medicine

## 2018-10-06 DIAGNOSIS — F429 Obsessive-compulsive disorder, unspecified: Secondary | ICD-10-CM

## 2018-10-06 DIAGNOSIS — F419 Anxiety disorder, unspecified: Secondary | ICD-10-CM | POA: Insufficient documentation

## 2018-10-13 ENCOUNTER — Ambulatory Visit: Payer: Self-pay | Admitting: Physician Assistant

## 2018-10-17 ENCOUNTER — Ambulatory Visit: Payer: Self-pay | Admitting: Physician Assistant

## 2018-10-21 ENCOUNTER — Ambulatory Visit: Payer: Self-pay | Admitting: Physician Assistant

## 2019-10-12 ENCOUNTER — Ambulatory Visit: Payer: BC Managed Care – PPO | Attending: Internal Medicine

## 2019-10-12 ENCOUNTER — Other Ambulatory Visit: Payer: Self-pay

## 2019-10-12 DIAGNOSIS — Z20822 Contact with and (suspected) exposure to covid-19: Secondary | ICD-10-CM

## 2019-10-13 LAB — NOVEL CORONAVIRUS, NAA: SARS-CoV-2, NAA: NOT DETECTED

## 2020-01-20 ENCOUNTER — Ambulatory Visit: Payer: BC Managed Care – PPO | Admitting: Internal Medicine

## 2020-01-20 ENCOUNTER — Other Ambulatory Visit: Payer: Self-pay

## 2020-01-20 ENCOUNTER — Encounter: Payer: Self-pay | Admitting: Internal Medicine

## 2020-01-20 VITALS — BP 120/80 | HR 64 | Temp 98.0°F | Resp 16 | Ht 70.2 in | Wt 157.0 lb

## 2020-01-20 DIAGNOSIS — W57XXXA Bitten or stung by nonvenomous insect and other nonvenomous arthropods, initial encounter: Secondary | ICD-10-CM | POA: Diagnosis not present

## 2020-01-20 DIAGNOSIS — S30861A Insect bite (nonvenomous) of abdominal wall, initial encounter: Secondary | ICD-10-CM | POA: Insufficient documentation

## 2020-01-20 MED ORDER — TRIAMCINOLONE ACETONIDE 0.5 % EX CREA: 1.0000 "application " | TOPICAL_CREAM | Freq: Three times a day (TID) | CUTANEOUS | 1 refills | Status: DC

## 2020-01-20 MED ORDER — LEVOCETIRIZINE DIHYDROCHLORIDE 5 MG PO TABS: 5.0000 mg | ORAL_TABLET | Freq: Every evening | ORAL | 0 refills | Status: DC

## 2020-01-20 MED ORDER — DOXYCYCLINE HYCLATE 100 MG PO TABS: 100.0000 mg | ORAL_TABLET | Freq: Two times a day (BID) | ORAL | 0 refills | Status: AC

## 2020-01-20 NOTE — Progress Notes (Signed)
Subjective:  Patient ID: Edwin Ortiz, male    DOB: 01/08/1996  Age: 24 y.o. MRN: [id number]  CC: Rash   HPI Edwin Ortiz presents for concerns about an area on his right flank.  He was bitten there 5 days ago by a small black tick.  He was in [address] near the [place].  He is concerned about contracting Lyme disease.  He says the area is red and itches but he denies headache, fever, chills, nausea, vomiting, or abdominal pain.  Outpatient Medications Prior to Visit  Medication Sig Dispense Refill  . sertraline (ZOLOFT) 100 MG tablet Take 100 mg by mouth daily.    Marland Kitchen VYVANSE 50 MG capsule Take 50 mg by mouth every morning.    Marland Kitchen amphetamine-dextroamphetamine (ADDERALL) 30 MG tablet Take 30 mg by mouth daily.    . fluvoxaMINE (LUVOX) 100 MG tablet Take 100 mg by mouth every morning.    . gabapentin (NEURONTIN) 300 MG capsule Take 300 mg by mouth 3 (three) times daily.    Marland Kitchen HYDROcodone-acetaminophen (NORCO) 5-325 MG tablet 1-2 tabs po q6 hours prn pain 30 tablet 0  . lisdexamfetamine (VYVANSE) 60 MG capsule Take 60 mg by mouth every morning.    . lisdexamfetamine (VYVANSE) 70 MG capsule Take 1 capsule (70 mg total) by mouth daily. 30 capsule 0  . lisdexamfetamine (VYVANSE) 70 MG capsule Take 1 capsule (70 mg total) by mouth daily. 30 capsule 0  . sulfamethoxazole-trimethoprim (BACTRIM DS) 800-160 MG tablet Take 1 tablet by mouth 2 (two) times daily. 14 tablet 0   No facility-administered medications prior to visit.    ROS Review of Systems  Constitutional: Negative for chills, diaphoresis, fatigue and fever.  HENT: Negative.   Eyes: Negative for visual disturbance.  Respiratory: Negative for cough, chest tightness, shortness of breath and wheezing.   Cardiovascular: Negative for chest pain, palpitations and leg swelling.  Gastrointestinal: Negative for abdominal pain, diarrhea, nausea and vomiting.  Endocrine: Negative.   Genitourinary: Negative.   Negative for difficulty urinating.  Musculoskeletal: Negative for arthralgias and myalgias.  Skin: Positive for rash. Negative for color change.  Neurological: Negative.  Negative for dizziness, weakness and light-headedness.  Hematological: Negative for adenopathy. Does not bruise/bleed easily.  Psychiatric/Behavioral: Negative.     Objective:  BP 120/80 (BP Location: Left Arm, Patient Position: Sitting, Cuff Size: Normal)   Pulse 64   Temp 98 F (36.7 C) (Oral)   Resp 16   Ht 5' 10.2" (1.783 m)   Wt 157 lb (71.2 kg)   SpO2 98%   BMI 22.40 kg/m   BP Readings from Last 3 Encounters:  01/20/20 120/80  07/04/17 100/68  04/25/17 114/72    Wt Readings from Last 3 Encounters:  01/20/20 157 lb (71.2 kg)  07/04/17 143 lb (64.9 kg) (33 %, Z= -0.44)*  04/25/17 148 lb (67.1 kg) (43 %, Z= -0.18)*   * Growth percentiles are based on CDC (Boys, 2-20 Years) data.    Physical Exam Vitals reviewed.  Constitutional:      General: He is not in acute distress.    Appearance: Normal appearance. He is not ill-appearing or toxic-appearing.  HENT:     Nose: Nose normal.     Mouth/Throat:     Mouth: Mucous membranes are moist.     Pharynx: No oropharyngeal exudate.  Eyes:     General: No scleral icterus.    Conjunctiva/sclera: Conjunctivae normal.  Cardiovascular:  Rate and Rhythm: Normal rate and regular rhythm.     Heart sounds: No murmur.  Pulmonary:     Effort: Pulmonary effort is normal.     Breath sounds: No stridor. No wheezing, rhonchi or rales.  Abdominal:     General: Abdomen is flat. Bowel sounds are normal. There is no distension.     Palpations: Abdomen is soft. There is no hepatomegaly, splenomegaly or mass.     Tenderness: There is no abdominal tenderness.  Musculoskeletal:        General: Normal range of motion.     Cervical back: Neck supple.     Right lower leg: No edema.     Left lower leg: No edema.  Lymphadenopathy:     Cervical: No cervical adenopathy.    Skin:    General: Skin is warm and dry.     Coloration: Skin is not pale.     Findings: Rash present.     Comments: Right lateral flank.  There is an erythematous macule with a central excoriation.  There is no remaining tick or foreign body.  There is no central clearing.  There is no warmth, induration, fluctuance, or streaking.  See photo.  Neurological:     General: No focal deficit present.     Mental Status: He is alert.     Lab Results  Component Value Date   WBC 12.0 (H) 04/25/2017   HGB 14.0 04/25/2017   HCT 43.9 04/25/2017   PLT 286 04/25/2017   GLUCOSE 90 04/25/2017   CHOL 127 04/25/2016   TRIG 127.0 04/25/2016   HDL 36.70 (L) 04/25/2016   LDLCALC 64 04/25/2016   ALT 30 04/25/2017   AST 30 04/25/2017   NA 137 04/25/2017   K 3.7 04/25/2017   CL 103 04/25/2017   CREATININE 0.89 04/25/2017   BUN 13 04/25/2017   CO2 26 04/25/2017   TSH 1.02 04/25/2016    No results found.  Assessment & Plan:   Edwin Ortiz was seen today for rash.  Diagnoses and all orders for this visit:  Tick bite of abdomen, initial encounter- Will prophylax against Lyme disease with a 3-week course of doxycycline.  This will also cover [facility] spotted fever.  He is having a localized allergic reaction so I recommended that he take an oral antihistamine and use a topical steroid. -     doxycycline (VIBRA-TABS) 100 MG tablet; Take 1 tablet (100 mg total) by mouth 2 (two) times daily for 21 days. -     triamcinolone cream (KENALOG) 0.5 %; Apply 1 application topically 3 (three) times daily.   I have discontinued Edwin Chapman "Parker"'s amphetamine-dextroamphetamine, HYDROcodone-acetaminophen, sulfamethoxazole-trimethoprim, lisdexamfetamine, lisdexamfetamine, lisdexamfetamine, fluvoxaMINE, and gabapentin. I am also having him start on doxycycline, triamcinolone cream, and levocetirizine. Additionally, I am having him maintain his sertraline and Vyvanse.  Meds ordered this encounter  Medications   . doxycycline (VIBRA-TABS) 100 MG tablet    Sig: Take 1 tablet (100 mg total) by mouth 2 (two) times daily for 21 days.    Dispense:  42 tablet    Refill:  0  . triamcinolone cream (KENALOG) 0.5 %    Sig: Apply 1 application topically 3 (three) times daily.    Dispense:  30 g    Refill:  1  . levocetirizine (XYZAL) 5 MG tablet    Sig: Take 1 tablet (5 mg total) by mouth every evening.    Dispense:  15 tablet    Refill:  0  Follow-up: Return if symptoms worsen or fail to improve.  Scarlette Calico, MD

## 2020-01-20 NOTE — Patient Instructions (Signed)

## 2021-06-20 ENCOUNTER — Ambulatory Visit (INDEPENDENT_AMBULATORY_CARE_PROVIDER_SITE_OTHER): Payer: 59 | Admitting: Internal Medicine

## 2021-06-20 ENCOUNTER — Other Ambulatory Visit: Payer: Self-pay

## 2021-06-20 ENCOUNTER — Encounter: Payer: Self-pay | Admitting: Internal Medicine

## 2021-06-20 DIAGNOSIS — L309 Dermatitis, unspecified: Secondary | ICD-10-CM

## 2021-06-20 DIAGNOSIS — W57XXXA Bitten or stung by nonvenomous insect and other nonvenomous arthropods, initial encounter: Secondary | ICD-10-CM

## 2021-06-20 DIAGNOSIS — F902 Attention-deficit hyperactivity disorder, combined type: Secondary | ICD-10-CM

## 2021-06-20 DIAGNOSIS — S30861A Insect bite (nonvenomous) of abdominal wall, initial encounter: Secondary | ICD-10-CM | POA: Diagnosis not present

## 2021-06-20 DIAGNOSIS — B079 Viral wart, unspecified: Secondary | ICD-10-CM

## 2021-06-20 DIAGNOSIS — F429 Obsessive-compulsive disorder, unspecified: Secondary | ICD-10-CM

## 2021-06-20 MED ORDER — SALICYLIC ACID 26 % EX SOLN
CUTANEOUS | 1 refills | Status: AC
Start: 2021-06-20 — End: ?

## 2021-06-20 MED ORDER — TRIAMCINOLONE ACETONIDE 0.5 % EX CREA: 1.0000 "application " | TOPICAL_CREAM | Freq: Three times a day (TID) | CUTANEOUS | 1 refills | Status: AC

## 2021-06-20 NOTE — Patient Instructions (Signed)
Please take all new medication as prescribed - the wart solution treatment, and the steroid cream as needed  Please continue all other medications as before, and refills have been done if requested.  Please have the pharmacy call with any other refills you may need.  Please keep your appointments with your specialists as you may have planned

## 2021-06-20 NOTE — Progress Notes (Signed)
aly

## 2021-06-24 ENCOUNTER — Encounter: Payer: Self-pay | Admitting: Internal Medicine

## 2021-06-24 DIAGNOSIS — L309 Dermatitis, unspecified: Secondary | ICD-10-CM | POA: Insufficient documentation

## 2021-06-24 DIAGNOSIS — B079 Viral wart, unspecified: Secondary | ICD-10-CM | POA: Insufficient documentation

## 2021-06-24 NOTE — Assessment & Plan Note (Signed)
Non infected, for triam cr prn,  to f/u any worsening symptoms or concerns  

## 2021-06-24 NOTE — Progress Notes (Signed)
Patient ID: Mehtab Dolberry, male   DOB: 12-27-1995, 25 y.o.   MRN: [id number]        Chief Complaint: follow up wart middle finger right hand       HPI:  Deleon Passe is a 25 y.o. male iwith c/o new onset 6 mo wart like lesion to mid lateral aspect middle finger right hand, not amenable to otc preps or digging at it.  Constant, o/w asymptomatic.  Nothing else seems to make better or worse.  Also had area of irritation to right wrist area with scaliness for several months, itchy, cant stop scratching it, but no worsening red, tender, swelling, drainage or chills.  Also has an area of irritation to left abdomen after tick bite over a wk ago without swelling or drainage as well.    Has not been taking zoloft or vyvanse recently, does not think he needs.  Denies worsening depressive symptoms, suicidal ideation, or panic; has ongoing anxiety       Wt Readings from Last 3 Encounters:  06/20/21 174 lb (78.9 kg)  01/20/20 157 lb (71.2 kg)  07/04/17 143 lb (64.9 kg) (33 %, Z= -0.44)*   * Growth percentiles are based on CDC (Boys, 2-20 Years) data.   BP Readings from Last 3 Encounters:  06/20/21 108/60  01/20/20 120/80  07/04/17 100/68         Past Medical History:  Diagnosis Date   ADHD (attention deficit hyperactivity disorder) 2017   Anxiety    Depression    Past Surgical History:  Procedure Laterality Date   ANTERIOR CRUCIATE LIGAMENT REPAIR Left 09/27/2015   I & D EXTREMITY Right 04/25/2017   Procedure: IRRIGATION AND DEBRIDEMENT SMALL FINGER;  Surgeon: Betha Loa, MD;  Location: [facility] OR;  Service: Orthopedics;  Laterality: Right;   TONSILLECTOMY      reports that he has been smoking cigarettes. He has a 0.50 pack-year smoking history. He has never used smokeless tobacco. He reports current alcohol use of about 5.0 standard drinks per week. He reports current drug use. Drug: Marijuana. family history includes High Cholesterol in his father and mother; Hypertension in his mother and  paternal grandmother; Leukemia in his paternal grandfather. No Known Allergies No current outpatient medications on file prior to visit.   No current facility-administered medications on file prior to visit.        ROS:  All others reviewed and negative.  Objective        PE:  BP 108/60 (BP Location: Right Arm, Patient Position: Sitting, Cuff Size: Normal)   Pulse 62   Temp 98 F (36.7 C) (Oral)   Ht 5\' 10"  (1.778 m)   Wt 174 lb (78.9 kg)   SpO2 97%   BMI 24.97 kg/m                 Constitutional: Pt appears in NAD               HENT: Head: NCAT.                Right Ear: External ear normal.                 Left Ear: External ear normal.                Eyes: . Pupils are equal, round, and reactive to light. Conjunctivae and EOM are normal               Nose: without d/c  or deformity               Neck: Neck supple. Gross normal ROM               Cardiovascular: Normal rate and regular rhythm.                 Pulmonary/Chest: Effort normal and breath sounds without rales or wheezing.                Abd:  Soft, NT, ND, + BS, no organomegaly               Neurological: Pt is alert. At baseline orientation, motor grossly intact               Skin:  LE edema - none 3rd finger right hand lateral aspect 10 mm raised warty like lesion, also scaly nontender flat erythemarea right wrist approx 1.5 cm oval, also left abd with mild nontender non raised redness at tick bite site               Psychiatric: Pt behavior is normal without agitation   Micro: none  Cardiac tracings I have personally interpreted today:  none  Pertinent Radiological findings (summarize): none   Lab Results  Component Value Date   WBC 12.0 (H) 04/25/2017   HGB 14.0 04/25/2017   HCT 43.9 04/25/2017   PLT 286 04/25/2017   GLUCOSE 90 04/25/2017   CHOL 127 04/25/2016   TRIG 127.0 04/25/2016   HDL 36.70 (L) 04/25/2016   LDLCALC 64 04/25/2016   ALT 30 04/25/2017   AST 30 04/25/2017   NA 137 04/25/2017   K  3.7 04/25/2017   CL 103 04/25/2017   CREATININE 0.89 04/25/2017   BUN 13 04/25/2017   CO2 26 04/25/2017   TSH 1.02 04/25/2016   Assessment/Plan:  Guillermo Nehring is a 25 y.o. White or Caucasian [1] male with  has a past medical history of ADHD (attention deficit hyperactivity disorder) (2017), Anxiety, and Depression.  Viral wart on finger Mild to mod, for salicylic acid asd, to f/u any worsening symptoms or concerns  Dermatitis Non infected, for triam cr prn,  to f/u any worsening symptoms or concerns  Tick bite of abdomen Non infected, for triam cr prn,  to f/u any worsening symptoms or concerns   Attention deficit hyperactivity disorder (ADHD), combined type Declines recent vyvanse,  to f/u any worsening symptoms or concerns  OCD (obsessive compulsive disorder) Has decliened to take zoloft for now,  to f/u any worsening symptoms or concerns  Followup: Return if symptoms worsen or fail to improve.  Oliver Barre, MD 06/24/2021 3:59 PM  [facility]

## 2021-06-24 NOTE — Assessment & Plan Note (Signed)
Declines recent vyvanse,  to f/u any worsening symptoms or concerns

## 2021-06-24 NOTE — Assessment & Plan Note (Signed)
Non infected, for triam cr prn,  to f/u any worsening symptoms or concerns

## 2021-06-24 NOTE — Assessment & Plan Note (Signed)
Has decliened to take zoloft for now,  to f/u any worsening symptoms or concerns

## 2021-06-24 NOTE — Assessment & Plan Note (Signed)
Mild to mod, for salicylic acid asd, to f/u any worsening symptoms or concerns
